# Patient Record
Sex: Male | Born: 1990 | Race: Black or African American | Hispanic: No | Marital: Single | Smoking: Never smoker
Health system: Southern US, Community
[De-identification: ages and names within clinical notes are randomized; demographics above are authoritative.]

---
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
## 2016-11-07 ENCOUNTER — Encounter (HOSPITAL_BASED_OUTPATIENT_CLINIC_OR_DEPARTMENT_OTHER): Payer: Self-pay | Admitting: Emergency Medicine

## 2016-11-07 ENCOUNTER — Emergency Department (HOSPITAL_BASED_OUTPATIENT_CLINIC_OR_DEPARTMENT_OTHER)
Admission: EM | Admit: 2016-11-07 | Discharge: 2016-11-07 | Disposition: A | Discharge status: Home / Self Care | Payer: BLUE CROSS/BLUE SHIELD | Attending: Emergency Medicine | Admitting: Emergency Medicine

## 2016-11-07 ENCOUNTER — Emergency Department (HOSPITAL_BASED_OUTPATIENT_CLINIC_OR_DEPARTMENT_OTHER): Payer: BLUE CROSS/BLUE SHIELD

## 2016-11-07 DIAGNOSIS — R072 Precordial pain: Secondary | ICD-10-CM | POA: Diagnosis not present

## 2016-11-07 DIAGNOSIS — J029 Acute pharyngitis, unspecified: Secondary | ICD-10-CM | POA: Insufficient documentation

## 2016-11-07 DIAGNOSIS — R0789 Other chest pain: Secondary | ICD-10-CM | POA: Diagnosis not present

## 2016-11-07 DIAGNOSIS — R079 Chest pain, unspecified: Secondary | ICD-10-CM | POA: Diagnosis not present

## 2016-11-07 LAB — CBC
HCT: 43.1 % (ref 39.0–52.0)
Hemoglobin: 15 g/dL (ref 13.0–17.0)
MCH: 30.5 pg (ref 26.0–34.0)
MCHC: 34.8 g/dL (ref 30.0–36.0)
MCV: 87.8 fL (ref 78.0–100.0)
Platelets: 243 10*3/uL (ref 150–400)
RBC: 4.91 MIL/uL (ref 4.22–5.81)
RDW: 13.2 % (ref 11.5–15.5)
WBC: 8.6 10*3/uL (ref 4.0–10.5)

## 2016-11-07 LAB — BASIC METABOLIC PANEL
Anion gap: 9 (ref 5–15)
BUN: 14 mg/dL (ref 6–20)
CO2: 26 mmol/L (ref 22–32)
Calcium: 9.6 mg/dL (ref 8.9–10.3)
Chloride: 101 mmol/L (ref 101–111)
Creatinine, Ser: 0.98 mg/dL (ref 0.61–1.24)
GFR calc Af Amer: >60 mL/min (ref >60)
GFR calc non Af Amer: >60 mL/min (ref >60)
Glucose, Bld: 105 mg/dL — ABNORMAL HIGH (ref 65–99)
Potassium: 3.4 mmol/L — ABNORMAL LOW (ref 3.5–5.1)
Sodium: 136 mmol/L (ref 135–145)

## 2016-11-07 LAB — TROPONIN I: Troponin I: <0.03 ng/mL (ref <0.03)

## 2016-11-07 LAB — RAPID STREP SCREEN (MED CTR MEBANE ONLY): Streptococcus, Group A Screen (Direct): NEGATIVE

## 2016-11-07 MED ORDER — ALUM & MAG HYDROXIDE-SIMETH 200-200-20 MG/5ML PO SUSP
15.0000 mL | Freq: Once | ORAL | Status: AC
  Administered 2016-11-07: 15 mL via ORAL
  Filled 2016-11-07: qty 30

## 2016-11-07 MED ORDER — FAMOTIDINE 20 MG PO TABS
20.0000 mg | ORAL_TABLET | Freq: Once | ORAL | Status: AC
  Administered 2016-11-07: 20 mg via ORAL
  Filled 2016-11-07: qty 1

## 2016-11-07 MED ORDER — POTASSIUM CHLORIDE CRYS ER 20 MEQ PO TBCR
20.0000 meq | EXTENDED_RELEASE_TABLET | Freq: Once | ORAL | Status: AC
  Administered 2016-11-07: 20 meq via ORAL
  Filled 2016-11-07: qty 1

## 2016-11-07 MED ORDER — ACETAMINOPHEN 500 MG PO TABS
1000.0000 mg | ORAL_TABLET | Freq: Once | ORAL | Status: AC
  Administered 2016-11-07: 1000 mg via ORAL
  Filled 2016-11-07: qty 2

## 2016-11-07 NOTE — ED Triage Notes (Signed)
Pt presents with c/o sore throat and chest pain for a few days.

## 2016-11-07 NOTE — ED Notes (Signed)
Pt. returned from XR. 

## 2016-11-07 NOTE — ED Notes (Signed)
ED Provider at bedside. 

## 2016-11-07 NOTE — ED Provider Notes (Addendum)
[facility]   CSN: [id number] & time: 11/07/16  2056     History   Chief Complaint Chief Complaint  Patient presents with  . Chest Pain    HPI Jonathan Ferguson is a 26 y.o. male.  Patient c/o mid chest pain for the past few days. Pain constant, dull, mild. Occurs at rest. No relation to activity or exertion. No associated sob, nv or diaphoresis. Mild scratchy throat. No trouble breathing or swallowing. Denies hx gerd, or having heartburn. No pleuritic pain. No leg pain or swelling. No prior hx heart disease. No fam hx premature cad. Denies cough. No fever or chills. Denies chest wall injury or strain. No drug use.     Chest Pain   Pertinent negatives include no abdominal pain, no back pain, no cough, no fever, no headaches, no nausea, no palpitations, no shortness of breath and no vomiting.    History reviewed. No pertinent past medical history.  There are no active problems to display for this patient.   History reviewed. No pertinent surgical history.     Home Medications    Prior to Admission medications   Not on File    Family History No family history on file.  Social History Social History  Substance Use Topics  . Smoking status: Not on file  . Smokeless tobacco: Not on file  . Alcohol use Not on file     Allergies   Patient has no known allergies.   Review of Systems Review of Systems  Constitutional: Negative for chills and fever.  HENT: Positive for sore throat.   Eyes: Negative for redness.  Respiratory: Negative for cough and shortness of breath.   Cardiovascular: Positive for chest pain. Negative for palpitations and leg swelling.  Gastrointestinal: Negative for abdominal pain, nausea and vomiting.  Genitourinary: Negative for flank pain.  Musculoskeletal: Negative for back pain and neck pain.  Skin: Negative for rash.  Neurological: Negative for headaches.  Hematological: Does not bruise/bleed  easily.  Psychiatric/Behavioral: Negative for confusion.     Physical Exam Updated Vital Signs BP (!) 142/87 (BP Location: Right Arm)   Pulse 72   Temp 98 F (36.7 C) (Oral)   Resp (!) 23   SpO2 100%   Physical Exam  Constitutional: He appears well-developed and well-nourished. No distress.  HENT:  Mouth/Throat: Oropharynx is clear and moist.  Eyes: Conjunctivae are normal.  Neck: Neck supple. No tracheal deviation present.  Cardiovascular: Normal rate, regular rhythm, normal heart sounds and intact distal pulses.  Exam reveals no gallop and no friction rub.   No murmur heard. Pulmonary/Chest: Effort normal and breath sounds normal. No accessory muscle usage. No respiratory distress. He exhibits no tenderness.  Abdominal: Soft. Bowel sounds are normal. He exhibits no distension. There is no tenderness.  Musculoskeletal: He exhibits no edema or tenderness.  Neurological: He is alert.  Skin: Skin is warm and dry. He is not diaphoretic.  Psychiatric: He has a normal mood and affect.  Nursing note and vitals reviewed.    ED Treatments / Results  Labs (all labs ordered are listed, but only abnormal results are displayed) Results for orders placed or performed during the hospital encounter of 11/07/16  Rapid strep screen  Result Value Ref Range   Streptococcus, Group A Screen (Direct) NEGATIVE NEGATIVE  Basic metabolic panel  Result Value Ref Range   Sodium 136 135 - 145 mmol/L   Potassium 3.4 (L) 3.5 - 5.1 mmol/L  Chloride 101 101 - 111 mmol/L   CO2 26 22 - 32 mmol/L   Glucose, Bld 105 (H) 65 - 99 mg/dL   BUN 14 6 - 20 mg/dL   Creatinine, Ser1.610.98 0.61 - 1.24 mg/dL   Calcium 9.6 8.9 - 09.6 mg/dL   GFR calc non Af Amer >60 >60 mL/min   GFR calc Af Amer >60 >60 mL/min   Anion gap 9 5 - 15  CBC  Result Value Ref Range   WBC 8.6 4.0 - 10.5 K/uL   RBC 4.91 4.22 - 5.81 MIL/uL   Hemoglobin 15.0 13.0 - 17.0 g/dL   HCT 04.5 40.9 - 81.1 %   MCV 87.8 78.0 - 100.0 fL   MCH  30.5 26.0 - 34.0 pg   MCHC 34.8 30.0 - 36.0 g/dL   RDW 91.4 78.2 - 95.6 %   Platelets 243 150 - 400 K/uL  Troponin I  Result Value Ref Range   Troponin I <0.03 <0.03 ng/mL   Dg Chest 2 View  Result Date: 11/07/2016 CLINICAL DATA:  Sore throat x3 days with left upper chest pain. EXAM: CHEST  2 VIEW COMPARISON:  None. FINDINGS: The heart size and mediastinal contours are within normal limits. Both lungs are clear. No pulmonary consolidation, effusion or pneumothorax. The visualized skeletal structures are unremarkable. IMPRESSION: No active cardiopulmonary disease. Electronically Signed   By: Tollie Eth M.D.   On: 11/07/2016 21:38    EKG  EKG Interpretation  Date/Time:  Saturday November 07 2016 21:30:86 EDT Ventricular Rate:  80 PR Interval:    QRS Duration: 93 QT Interval:  365 QTC Calculation: 421 R Axis:   95 Text Interpretation:  Sinus rhythm Non-specific ST-t changes No previous tracing Confirmed by Cathren Laine ([id number]) on 11/07/2016 9:20:48 PM       Radiology Dg Chest 2 View  Result Date: 11/07/2016 CLINICAL DATA:  Sore throat x3 days with left upper chest pain. EXAM: CHEST  2 VIEW COMPARISON:  None. FINDINGS: The heart size and mediastinal contours are within normal limits. Both lungs are clear. No pulmonary consolidation, effusion or pneumothorax. The visualized skeletal structures are unremarkable. IMPRESSION: No active cardiopulmonary disease. Electronically Signed   By: Tollie Eth M.D.   On: 11/07/2016 21:38    Procedures Procedures (including critical care time)  Medications Ordered in ED Medications  acetaminophen (TYLENOL) tablet 1,000 mg (not administered)  alum & mag hydroxide-simeth (MAALOX/MYLANTA) 200-200-20 MG/5ML suspension 15 mL (not administered)  famotidine (PEPCID) tablet 20 mg (not administered)     Initial Impression / Assessment and Plan / ED Course  I have reviewed the triage vital signs and the nursing notes.  Pertinent labs & imaging  results that were available during my care of the patient were reviewed by me and considered in my medical decision making (see chart for details).  Labs. Ecg. Cxr.   After symptoms for past few days, trop 0 - symptoms/eval do not appears c/w ACS.  Will try pepcid, maalox, acetaminophen for symptom relief.  From labs, k sl low, kcl po.  cxr neg. No cm. Lung clear.   On recheck, pt resting comfortably. No distress.   Pt currently appears stable for d/c.     Final Clinical Impressions(s) / ED Diagnoses   Final diagnoses:  None    New Prescriptions New Prescriptions   No medications on file       Cathren Laine, MD 11/07/16 2246

## 2016-11-07 NOTE — Discharge Instructions (Signed)
It was our pleasure to provide your ER care today - we hope that you feel better.  Take acetaminophen and/or ibuprofen as need for pain.    If gi symptoms/heartburn, you may try pepcid and maalox as need.   Follow up with primary care doctor in the next 1-2 weeks.   Your blood pressure is borderline high today - have it rechecked by primary care doctor.   Return to ER if worse, persistent or recurrent chest pain, trouble breathing, high fevers, other concern.   From today's lab tests, your potassium level is slightly low (3.4) - eat plenty of fruits and vegetables, and follow up with primary care doctor in the next 1-2 weeks.

## 2016-11-09 ENCOUNTER — Emergency Department (HOSPITAL_COMMUNITY)
Admission: EM | Admit: 2016-11-09 | Discharge: 2016-11-10 | Disposition: A | Discharge status: Home / Self Care | Payer: BLUE CROSS/BLUE SHIELD | Attending: Emergency Medicine | Admitting: Emergency Medicine

## 2016-11-09 DIAGNOSIS — J029 Acute pharyngitis, unspecified: Secondary | ICD-10-CM | POA: Diagnosis present

## 2016-11-09 DIAGNOSIS — B349 Viral infection, unspecified: Secondary | ICD-10-CM

## 2016-11-09 NOTE — ED Triage Notes (Signed)
Pt arrives via POv from home with chest pain with coughing, sore throat and bodyaches. Denies recent fever. VSS.

## 2016-11-09 NOTE — ED Provider Notes (Signed)
[facility]-EMERGENCY DEPT Provider Note   CSN: [id number] Arrival date & time: 11/09/16  1819     History   Chief Complaint Chief Complaint  Patient presents with  . Generalized Body Aches  . Sore Throat    HPI Jonathan Ferguson is a 26 y.o. male.  HPI   26 year old Male presenting with cold symptoms. For the past 2-3 days patient has had chest congestion, throat irritation nonproductive cough, body aches, decreased in activity level and occasional chills. He was initially seen in the ER for the symptoms 2 days ago for she initial chest pain. His workup was unremarkable. Normal chest x-ray, mild hypokalemia which he receive some supplementation, his EKG was unremarkable. He is not a smoker, does have family history of cardiac disease but no appreciable mature cardiac death. Denies any recent sick contact. Denies any shortness of breath, no risk factor for PE. No report of nausea vomiting or diarrhea, no abdominal pain or back pain.  No past medical history on file.  There are no active problems to display for this patient.   No past surgical history on file.     Home Medications    Prior to Admission medications   Not on File    Family History No family history on file.  Social History Social History  Substance Use Topics  . Smoking status: Not on file  . Smokeless tobacco: Not on file  . Alcohol use Not on file     Allergies   Patient has no known allergies.   Review of Systems Review of Systems  All other systems reviewed and are negative.    Physical Exam Updated Vital Signs BP 129/75 (BP Location: Right Arm)   Pulse (!) 57   Temp 98.8 F (37.1 C) (Oral)   Resp 14   Ht  (1.93 m)   Wt 88.5 kg (195 lb)   SpO2 99%   BMI 23.74 kg/m   Physical Exam  Constitutional: He is oriented to person, place, and time. He appears well-developed and well-nourished. No distress.  HENT:  Head: Atraumatic.  Right Ear: External ear normal.  Left Ear:  External ear normal.  Nose: Nose normal.  Mouth/Throat: Oropharynx is clear and moist.  Eyes: Conjunctivae are normal.  Neck: Normal range of motion. Neck supple.  No nuchal rigidity  Cardiovascular: Normal rate, regular rhythm and intact distal pulses.  Exam reveals no gallop and no friction rub.   No murmur heard. Pulmonary/Chest: Effort normal and breath sounds normal. No respiratory distress. He has no wheezes. He has no rales. He exhibits no tenderness.  Abdominal: Soft. Bowel sounds are normal. He exhibits no distension. There is no tenderness.  Musculoskeletal: He exhibits no edema.  Neurological: He is alert and oriented to person, place, and time.  Skin: No rash noted.  Psychiatric: He has a normal mood and affect.  Nursing note and vitals reviewed.    ED Treatments / Results  Labs (all labs ordered are listed, but only abnormal results are displayed) Labs Reviewed - No data to display  EKG  EKG Interpretation None       Radiology No results found.  Procedures Procedures (including critical care time)  Medications Ordered in ED Medications - No data to display   Initial Impression / Assessment and Plan / ED Course  I have reviewed the triage vital signs and the nursing notes.  Pertinent labs & imaging results that were available during my care of the patient were reviewed by  me and considered in my medical decision making (see chart for details).     BP 129/75 (BP Location: Right Arm)   Pulse (!) 57   Temp 98.8 F (37.1 C) (Oral)   Resp 14   Ht  (1.93 m)   Wt 88.5 kg (195 lb)   SpO2 99%   BMI 23.74 kg/m    Final Clinical Impressions(s) / ED Diagnoses   Final diagnoses:  Viral illness    New Prescriptions New Prescriptions   No medications on file   Pt symptoms consistent with URI. CXR 2 days ago negative for acute infiltrate. sxs not suggestive of ACS or PE.  Throat exam unremarkable.  Doubt strep infection.  Pt will be discharged  with symptomatic treatment.  Discussed return precautions.  Pt is hemodynamically stable & in NAD prior to discharge.    Fayrene Helper, PA-C 11/10/16 0006    Linwood Dibbles, MD 11/11/16 [phone]

## 2016-11-10 LAB — CULTURE, GROUP A STREP (THRC)

## 2016-11-10 MED ORDER — BENZONATATE 100 MG PO CAPS: 100.0000 mg | ORAL_CAPSULE | Freq: Three times a day (TID) | ORAL | 0 refills | Status: DC

## 2016-11-10 MED ORDER — PROMETHAZINE-DM 6.25-15 MG/5ML PO SYRP: 5.0000 mL | ORAL_SOLUTION | Freq: Four times a day (QID) | ORAL | 0 refills | Status: DC | PRN

## 2016-11-13 ENCOUNTER — Encounter (HOSPITAL_COMMUNITY): Payer: Self-pay | Admitting: *Deleted

## 2016-11-13 ENCOUNTER — Emergency Department (HOSPITAL_COMMUNITY)
Admission: EM | Admit: 2016-11-13 | Discharge: 2016-11-13 | Disposition: A | Discharge status: Home / Self Care | Payer: BLUE CROSS/BLUE SHIELD | Attending: Emergency Medicine | Admitting: Emergency Medicine

## 2016-11-13 ENCOUNTER — Emergency Department (HOSPITAL_COMMUNITY): Payer: BLUE CROSS/BLUE SHIELD

## 2016-11-13 DIAGNOSIS — B349 Viral infection, unspecified: Secondary | ICD-10-CM | POA: Diagnosis not present

## 2016-11-13 DIAGNOSIS — R0789 Other chest pain: Secondary | ICD-10-CM | POA: Insufficient documentation

## 2016-11-13 DIAGNOSIS — R079 Chest pain, unspecified: Secondary | ICD-10-CM | POA: Diagnosis not present

## 2016-11-13 MED ORDER — ONDANSETRON HCL 4 MG PO TABS: 4.0000 mg | ORAL_TABLET | Freq: Three times a day (TID) | ORAL | 0 refills | Status: DC | PRN

## 2016-11-13 NOTE — ED Provider Notes (Signed)
[facility]-EMERGENCY DEPT Provider Note   CSN: [id number] Arrival date & time: 11/13/16  1733     History   Chief Complaint Chief Complaint  Patient presents with  . Torticollis    HPI Jonathan Ferguson is a 26 y.o. male with no significant past medical history who presents today with chief complaint gradual onset, ongoing intermittent chest pain, rare nonproductive cough, scratchy throat, and generalized fatigue for 1 week. Patient was seen and evaluated on 11/07/2016 and 11/09/2016 for the same and was found to be stable for discharge home with thought of likely viral syndrome. He states that chest pain is more of a mild crampy discomfort which only occurs when he swallows or turns certain ways. At times right-sided, other times left-sided. Does not worsen with position changes. Also endorses mildly scratchy throat with some pain on swallowing, but denies drooling or facial swelling. No throat tightness. He is able to eat and drink, but endorses decreased appetite. He also endorses nausea but denies abdominal pain or vomiting. Denies shortness of breath, DOE, orthopnea, leg swelling, fevers, chills. He has tried [employer] and promethazine without relief. He denies nasal congestion, ear pain, headaches, or any other symptoms. No recent travel or surgeries, no hemoptysis, no prior history of DVT or PE, no history of cancer, he is a nonsmoker, he is not on testosterone therapy. no history of drug use.  The history is provided by the patient.    History reviewed. No pertinent past medical history.  There are no active problems to display for this patient.   History reviewed. No pertinent surgical history.     Home Medications    Prior to Admission medications   Medication Sig Start Date End Date Taking? Authorizing Provider  benzonatate (TESSALON) 100 MG capsule Take 1 capsule (100 mg total) by mouth every 8 (eight) hours. 11/10/16   Fayrene Helper, PA-C  ondansetron (ZOFRAN) 4 MG  tablet Take 1 tablet (4 mg total) by mouth every 8 (eight) hours as needed for nausea or vomiting. 11/13/16   Jeanie Sewer, PA-C    Family History No family history on file.  Social History Social History  Substance Use Topics  . Smoking status: Never Smoker  . Smokeless tobacco: Never Used  . Alcohol use No     Allergies   Patient has no known allergies.   Review of Systems Review of Systems  Constitutional: Positive for fatigue. Negative for chills and fever.  HENT: Positive for sore throat. Negative for congestion, drooling, facial swelling, rhinorrhea, sinus pain and sinus pressure.   Respiratory: Positive for cough. Negative for shortness of breath and wheezing.   Cardiovascular: Positive for chest pain. Negative for palpitations and leg swelling.  Gastrointestinal: Positive for nausea. Negative for abdominal pain and vomiting.  Musculoskeletal: Negative for back pain and neck pain.  Neurological: Negative for syncope, weakness, numbness and headaches.  All other systems reviewed and are negative.    Physical Exam Updated Vital Signs BP 121/72 (BP Location: Left Arm)   Pulse 60   Temp 99 F (37.2 C) (Oral)   Resp 17   SpO2 100%   Physical Exam  Constitutional: He is oriented to person, place, and time. He appears well-developed and well-nourished. No distress.  HENT:  Head: Normocephalic and atraumatic.  Right Ear: External ear normal.  Left Ear: External ear normal.  Mouth/Throat: Oropharynx is clear and moist.  TMs without bulging or erythema bilaterally. No frontal or maxillary sinus TTP. Nasal septum is midline  with pink mucosa and no nasal congestion. Posterior oropharynx without any significant erythema, tonsillar hypertrophy, uvular deviation, or exudates. No trismus or sublingual abnormalities.  Eyes: Pupils are equal, round, and reactive to light. Conjunctivae and EOM are normal. Right eye exhibits no discharge. Left eye exhibits no discharge.  Neck:  Normal range of motion. Neck supple. No JVD present. No tracheal deviation present. No thyromegaly present.  Left anterior cervical LAD. No midline spine TTP, no paraspinal muscle tenderness, no deformity, crepitus, or step-off noted   Cardiovascular: Normal rate, regular rhythm, normal heart sounds and intact distal pulses.   2+ radial and DP/PT pulses bl, negative Homan's bl   Pulmonary/Chest: Effort normal and breath sounds normal. No respiratory distress. He has no wheezes. He has no rales. He exhibits no tenderness.  Equal rise and fall of chest, no increased work of breathing  Abdominal: Soft. Bowel sounds are normal. There is no tenderness.  Musculoskeletal: Normal range of motion. He exhibits no edema or tenderness.  Neurological: He is alert and oriented to person, place, and time.  Skin: Skin is warm and dry. No erythema.  Psychiatric: He has a normal mood and affect. His behavior is normal.  Nursing note and vitals reviewed.    ED Treatments / Results  Labs (all labs ordered are listed, but only abnormal results are displayed) Labs Reviewed - No data to display  EKG  EKG Interpretation None       Radiology Dg Chest 2 View  Result Date: 11/13/2016 CLINICAL DATA:  Patient c/o CP x [redacted] week along with SOB. States that CP goes off and on. EXAM: CHEST  2 VIEW COMPARISON:  11/07/16 FINDINGS: The lungs are clear. The pulmonary vasculature is normal. Heart size is normal. Hilar and mediastinal contours are unremarkable. There is no pleural effusion. IMPRESSION: No active cardiopulmonary disease. Electronically Signed   By: Ellery Plunk M.D.   On: 11/13/2016 21:37    Procedures Procedures (including critical care time)  Medications Ordered in ED Medications - No data to display   Initial Impression / Assessment and Plan / ED Course  I have reviewed the triage vital signs and the nursing notes.  Pertinent labs & imaging results that were available during my care of  the patient were reviewed by me and considered in my medical decision making (see chart for details).     Patient with ongoing viral-type symptoms. Afebrile, vital signs are stable. He is nontoxic in appearance. EKG reviewed by my attending Dr. Estell Harpin and found to have no significant change from last tracing, no evidence of ST segment abnormality or arrhythmia. Chest x-ray shows no acute cardio pulmonary abnormalities. No evidence of pneumonia or pleural effusion. He is extremely low risk for cardiac etiology of his symptoms, and I doubt ACS or MI contributing. Low suspicion of pericarditis, myocarditis, and he is PERC negative so doubt PE. History and physical examination suggestive of viral etiology of symptoms. Examination of sore throat not concerning for strep pharyngitis, Ludwig's angina, PTA, or soft tissues infection of the neck. Possible mononucleosis, however he declines mono testing at this time. Alternatively, the symptoms could be related to a viral bronchitis. Advised of symptomatic treatment. Will discharge with Zofran as Phenergan has not been helpful. Recommend follow-up with primary care physician. Discussed indications for return to the ED. Patient and patient's father verbalized understanding of and agreement with plan and patient is stable for discharge at this time.  Final Clinical Impressions(s) / ED Diagnoses   Final  diagnoses:  Atypical chest pain  Viral disease    New Prescriptions Discharge Medication List as of 11/13/2016  9:48 PM       Jeanie Sewer, PA-C 11/13/16 2227    Bethann Berkshire, MD 11/13/16 2303

## 2016-11-13 NOTE — ED Notes (Signed)
Pt reports neck pain, sore throat, and cp x 1 week.  Was seen at [facility] Monday for same and was told it was viral infection.  He was given tessalon pearls and phenergan for cough without relief.  Pt reports he is not getting better.  Pt reports cough at times making his chest feel tight.  Pt is A&O x 4.  Family at bedside.

## 2016-11-13 NOTE — Discharge Instructions (Signed)
Alternate 600 mg of ibuprofen and [phone] mg of Tylenol every 3 hours as needed for pain. Do not exceed 4000 mg of Tylenol daily. Use warm water salt gargles, warm teas or soup, and throat lozenges to help with your scratchy throat. Take Zofran as needed up to 3 times daily for nausea. Stop taking Phenergan with this medication. Follow up with a primary care physician for reevaluation of your symptoms if they persist. Return to the ED immediately if any concerning signs or symptoms develop such as persistent chest pain, shortness of breath, coughing up blood, or fevers.

## 2016-11-18 DIAGNOSIS — R079 Chest pain, unspecified: Secondary | ICD-10-CM | POA: Diagnosis not present

## 2016-11-18 DIAGNOSIS — J029 Acute pharyngitis, unspecified: Secondary | ICD-10-CM | POA: Diagnosis not present

## 2016-11-24 DIAGNOSIS — J029 Acute pharyngitis, unspecified: Secondary | ICD-10-CM | POA: Diagnosis not present

## 2016-11-24 DIAGNOSIS — J069 Acute upper respiratory infection, unspecified: Secondary | ICD-10-CM | POA: Diagnosis not present

## 2016-12-06 DIAGNOSIS — M5489 Other dorsalgia: Secondary | ICD-10-CM | POA: Diagnosis not present

## 2016-12-06 DIAGNOSIS — M62838 Other muscle spasm: Secondary | ICD-10-CM | POA: Diagnosis not present

## 2016-12-14 DIAGNOSIS — R946 Abnormal results of thyroid function studies: Secondary | ICD-10-CM | POA: Diagnosis not present

## 2016-12-14 DIAGNOSIS — R079 Chest pain, unspecified: Secondary | ICD-10-CM | POA: Diagnosis not present

## 2016-12-14 DIAGNOSIS — R072 Precordial pain: Secondary | ICD-10-CM | POA: Diagnosis not present

## 2016-12-14 DIAGNOSIS — R55 Syncope and collapse: Secondary | ICD-10-CM | POA: Diagnosis not present

## 2016-12-14 DIAGNOSIS — R748 Abnormal levels of other serum enzymes: Secondary | ICD-10-CM | POA: Diagnosis not present

## 2016-12-18 DIAGNOSIS — R7989 Other specified abnormal findings of blood chemistry: Secondary | ICD-10-CM | POA: Diagnosis not present

## 2016-12-18 DIAGNOSIS — R0789 Other chest pain: Secondary | ICD-10-CM | POA: Diagnosis not present

## 2017-03-04 DIAGNOSIS — J029 Acute pharyngitis, unspecified: Secondary | ICD-10-CM | POA: Diagnosis not present

## 2017-04-28 DIAGNOSIS — M25562 Pain in left knee: Secondary | ICD-10-CM | POA: Diagnosis not present

## 2017-07-14 DIAGNOSIS — J029 Acute pharyngitis, unspecified: Secondary | ICD-10-CM | POA: Diagnosis not present

## 2017-09-27 DIAGNOSIS — J069 Acute upper respiratory infection, unspecified: Secondary | ICD-10-CM | POA: Diagnosis not present

## 2017-11-17 DIAGNOSIS — J029 Acute pharyngitis, unspecified: Secondary | ICD-10-CM | POA: Diagnosis not present

## 2019-01-15 IMAGING — CR DG CHEST 2V
2 series · 2 of 2 positions shown · non-contrast
Comparison: 11/07/16

CLINICAL DATA: Patient c/o CP x 1 week along with SOB. States that
CP goes off and on.

EXAM:
CHEST  2 VIEW

[w chest pa]
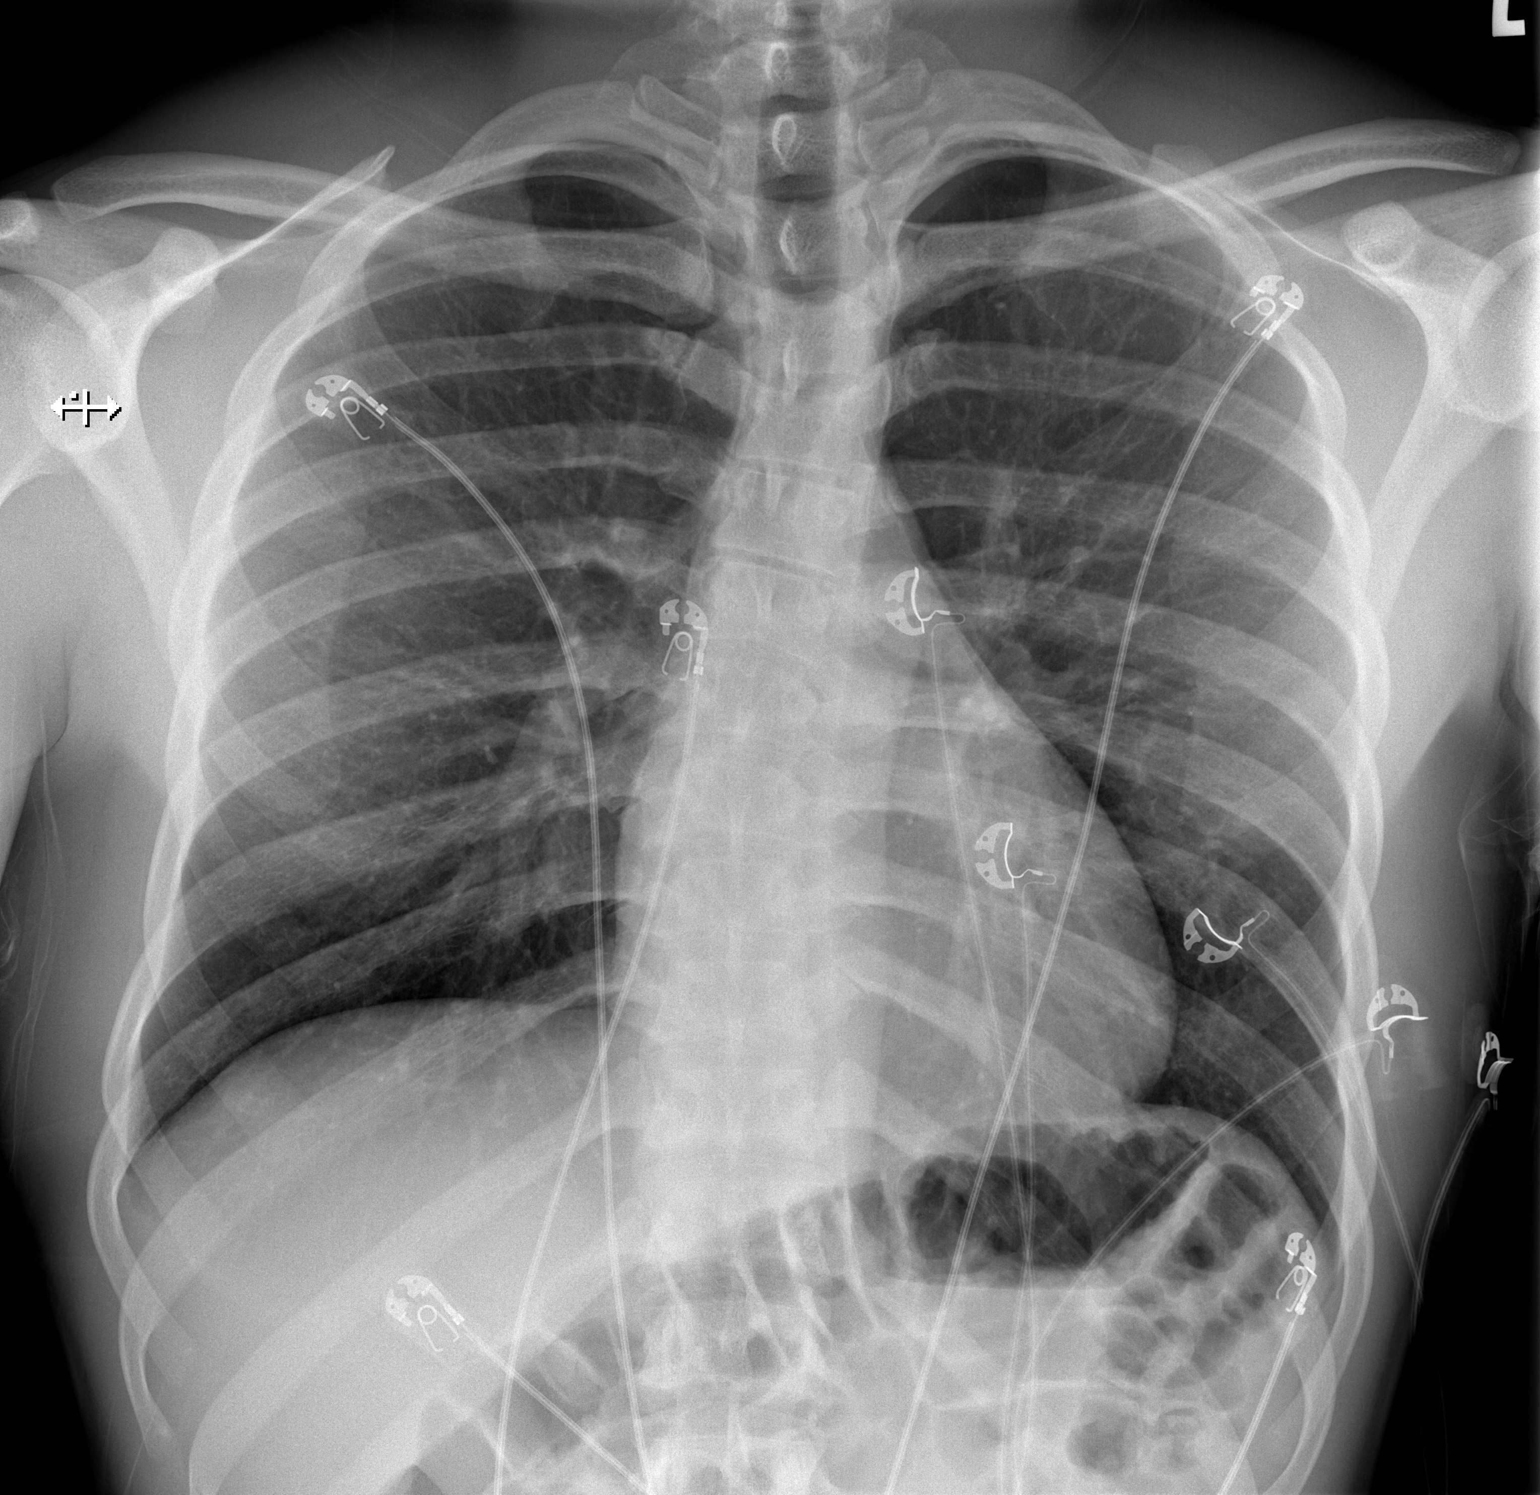

[w chest lat]
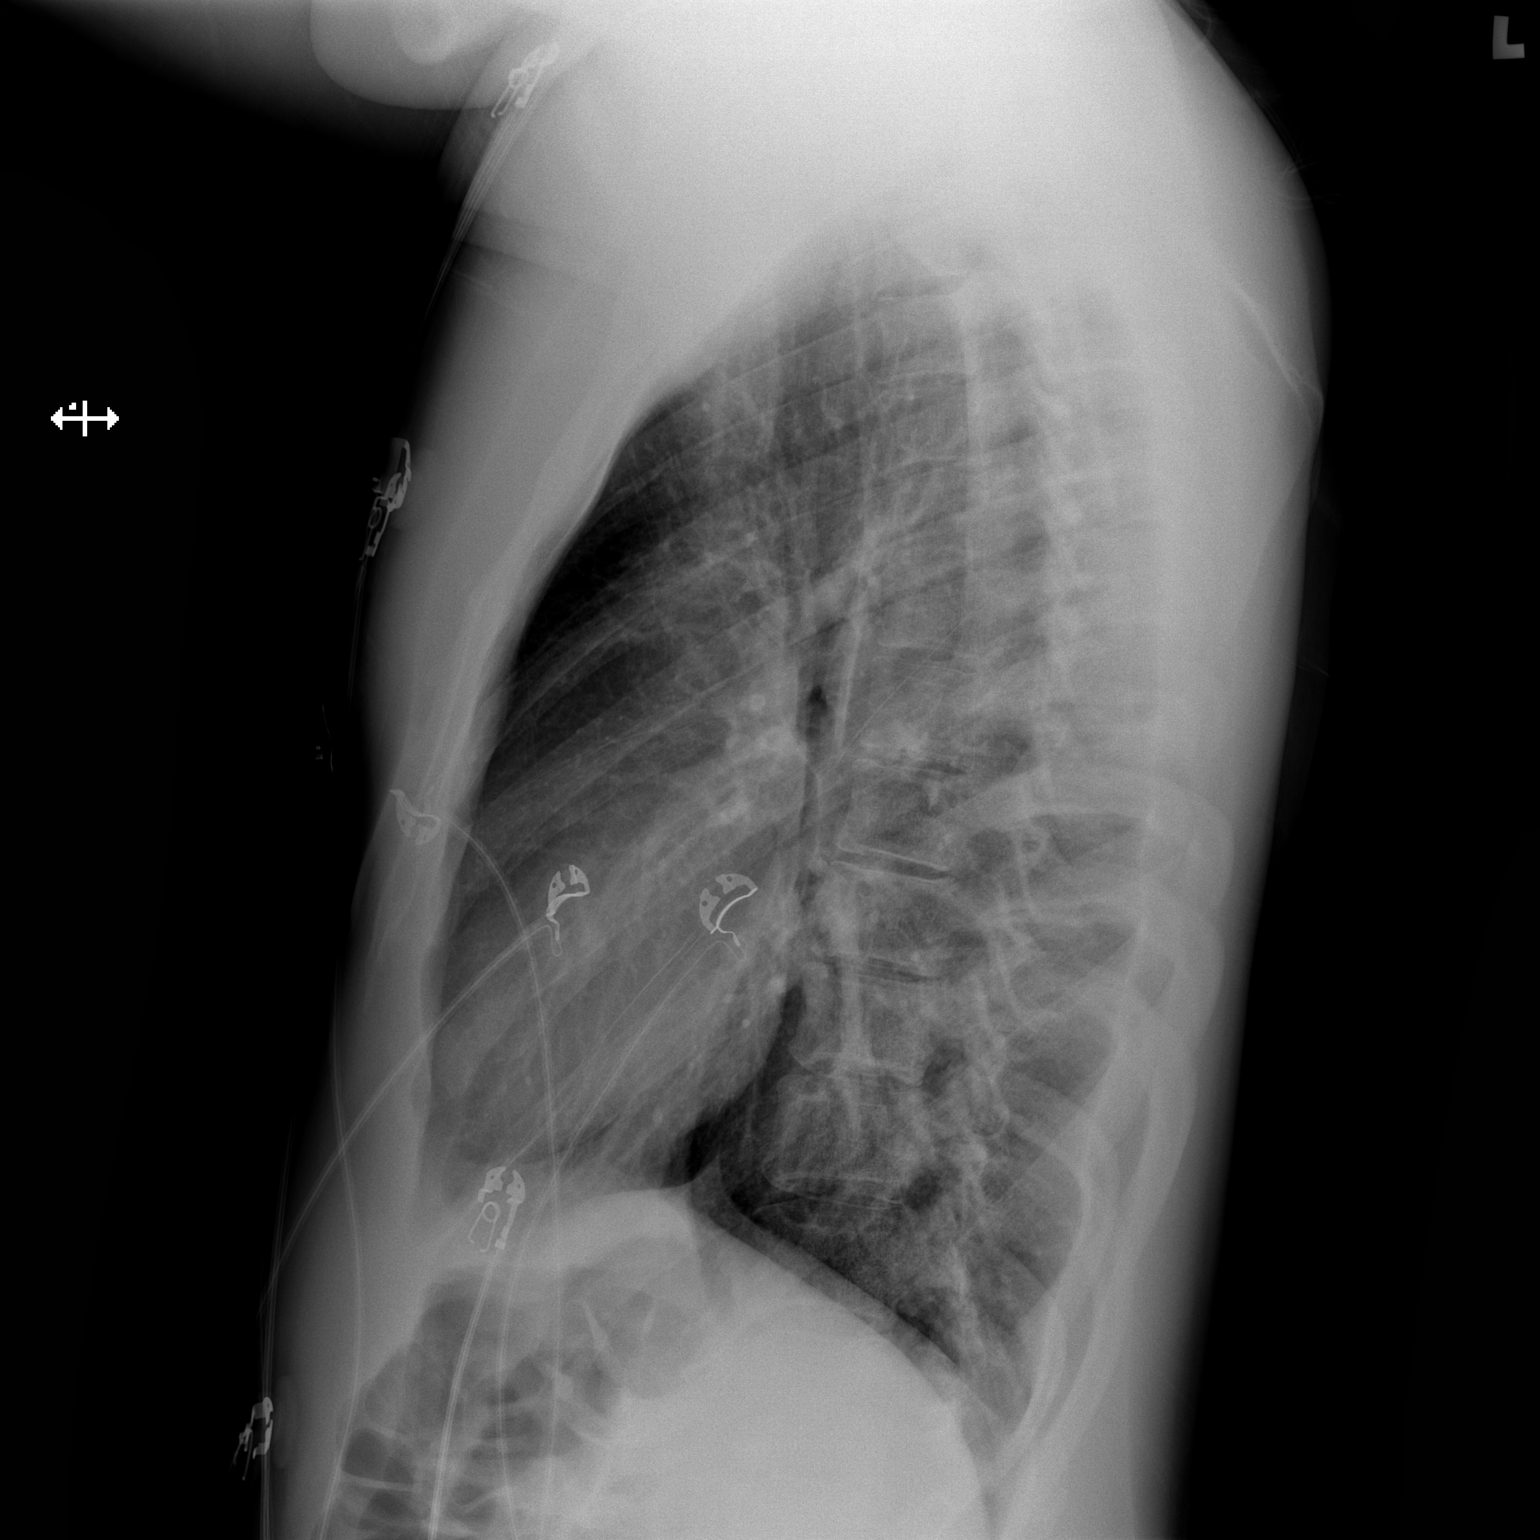

[2 of 2 positions shown; findings below may reference images not displayed]

FINDINGS: The lungs are clear. The pulmonary vasculature is normal. Heart size
is normal. Hilar and mediastinal contours are unremarkable. There is
no pleural effusion.
IMPRESSION: No active cardiopulmonary disease.

## 2020-12-21 ENCOUNTER — Ambulatory Visit: Admit: 2020-12-21 | Payer: Self-pay

## 2020-12-23 ENCOUNTER — Ambulatory Visit (INDEPENDENT_AMBULATORY_CARE_PROVIDER_SITE_OTHER): Payer: 59

## 2020-12-23 ENCOUNTER — Ambulatory Visit
Admission: EM | Admit: 2020-12-23 | Discharge: 2020-12-23 | Disposition: A | Discharge status: Home / Self Care | Payer: Self-pay | Source: Home / Self Care

## 2020-12-23 ENCOUNTER — Other Ambulatory Visit: Payer: Self-pay

## 2020-12-23 ENCOUNTER — Ambulatory Visit
Admission: EM | Admit: 2020-12-23 | Discharge: 2020-12-23 | Disposition: A | Discharge status: Home / Self Care | Payer: 59 | Attending: Physician Assistant | Admitting: Physician Assistant

## 2020-12-23 DIAGNOSIS — S99912A Unspecified injury of left ankle, initial encounter: Secondary | ICD-10-CM

## 2020-12-23 DIAGNOSIS — M25572 Pain in left ankle and joints of left foot: Secondary | ICD-10-CM

## 2020-12-23 NOTE — ED Provider Notes (Signed)
[facility]    CSN: [id number] Arrival date & time: 12/23/20  1001      History   Chief Complaint Chief Complaint  Patient presents with   appt 10 - ankle pain    HPI Jonathan Ferguson is a 30 y.o. male.   Patient here today for evaluation of left posterior lateral ankle pain that started after he rolled his ankle about 10 days ago.  He reports that he has had surgery about ankle in the past.  He states since rolling his ankle recently he has had increased discomfort and has been wearing a cam boot which does seem to be helpful.  He does not report treatment otherwise.  He denies any numbness or tingling.  Walking makes pain worse.  The history is provided by the patient.   History reviewed. No pertinent past medical history.  There are no problems to display for this patient.   History reviewed. No pertinent surgical history.     Home Medications    Prior to Admission medications   Not on File    Family History History reviewed. No pertinent family history.  Social History Social History   Tobacco Use   Smoking status: Never   Smokeless tobacco: Never  Substance Use Topics   Alcohol use: No   Drug use: No     Allergies   Patient has no known allergies.   Review of Systems Review of Systems  Constitutional:  Negative for chills and fever.  Eyes:  Negative for discharge and redness.  Respiratory:  Negative for shortness of breath.   Musculoskeletal:  Positive for arthralgias and joint swelling.  Skin:  Positive for color change and wound.  Neurological:  Negative for numbness.    Physical Exam Triage Vital Signs ED Triage Vitals  Enc Vitals Group     BP      Pulse      Resp      Temp      Temp src      SpO2      Weight      Height      Head Circumference      Peak Flow      Pain Score      Pain Loc      Pain Edu?      Excl. in GC?    No data found.  Updated Vital Signs BP (!) 147/87 (BP Location: Left Arm)    Pulse 67   Temp 98.3 F (36.8 C) (Oral)   Resp 18   SpO2 97%     Physical Exam Vitals and nursing note reviewed.  Constitutional:      General: He is not in acute distress.    Appearance: Normal appearance. He is not ill-appearing.  HENT:     Head: Normocephalic and atraumatic.  Eyes:     Conjunctiva/sclera: Conjunctivae normal.  Cardiovascular:     Rate and Rhythm: Normal rate.  Pulmonary:     Effort: Pulmonary effort is normal.  Musculoskeletal:     Comments: Full ROM of left ankle.  Mild tenderness to palpation to posterior lateral ankle.  Full ROM of left toes.  Neurological:     Mental Status: He is alert.     Comments: First sensation intact to distal left toes  Psychiatric:        Mood and Affect: Mood normal.        Behavior: Behavior normal.        Thought  Content: Thought content normal.     UC Treatments / Results  Labs (all labs ordered are listed, but only abnormal results are displayed) Labs Reviewed - No data to display  EKG   Radiology DG Ankle Complete Left  Result Date: 12/23/2020 CLINICAL DATA:  Trauma, pain EXAM: LEFT ANKLE COMPLETE - 3+ VIEW COMPARISON:  Images of previous study done on 04/26/2001 are not available for review. FINDINGS: No recent displaced fracture or dislocation is seen. There are small smooth marginated calcifications adjacent to the tips of medial and lateral malleoli, more so adjacent to the lateral malleolus. In the oblique view, there is faint linear lucency in the articular surface of tibia close to the medial malleolus. This finding could not be seen in the rest of the images. There is a surgical screw in the medial malleolus residual from previous internal fixation. There is a metallic sideplate and multiple surgical screws in the distal shaft of left tibia. A surgical screw is seen in the fifth metatarsal in the lateral view. There is soft tissue swelling over the lateral malleolus. IMPRESSION: No recent displaced fracture or  dislocation is seen. There is faint linear lucency in the the articular surface of distal tibia close to the medial malleolus seen in the single view. This may be an artifact due to confluence of normal trabeculae or undisplaced fracture. 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 Electronically Signed   By: Ernie Avena M.D.   On: 12/23/2020 10:59    Procedures Procedures (including critical care time)  Medications Ordered in UC Medications - No data to display  Initial Impression / Assessment and Plan / UC Course  I have reviewed the triage vital signs and the nursing notes.  Pertinent labs & imaging results that were available during my care of the patient were reviewed by me and considered in my medical decision making (see chart for details).  X-ray with questionable nondisplaced fracture, recommended given symptoms that patient continue to wear cam boot and follow-up with Ortho.  Patient agrees with same.  Encouraged ibuprofen if needed for pain in the meantime.  Final Clinical Impressions(s) / UC Diagnoses   Final diagnoses:  Injury of left ankle, initial encounter     Discharge Instructions      Please make follow up appointment with ortho. Recommend ibuprofen if needed for pain in the meantime.      ED Prescriptions   None    PDMP not reviewed this encounter.   Tomi Bamberger, PA-C 12/23/20 1122

## 2020-12-23 NOTE — Discharge Instructions (Signed)
Please make follow up appointment with ortho. Recommend ibuprofen if needed for pain in the meantime.

## 2020-12-23 NOTE — ED Triage Notes (Signed)
Pt states he rolled his lt ankle on 11/11. C/o still having swelling and pain. Hx of surgery to that ankle. Pt is wearing a cam boot with relief.

## 2023-02-24 IMAGING — DX DG ANKLE COMPLETE 3+V*L*
3 series · 3 of 3 positions shown · non-contrast
Comparison: Images of previous study done on 04/26/2001 are not
available for review.

CLINICAL DATA: Trauma, pain

EXAM:
LEFT ANKLE COMPLETE - 3+ VIEW

[ankle ap]
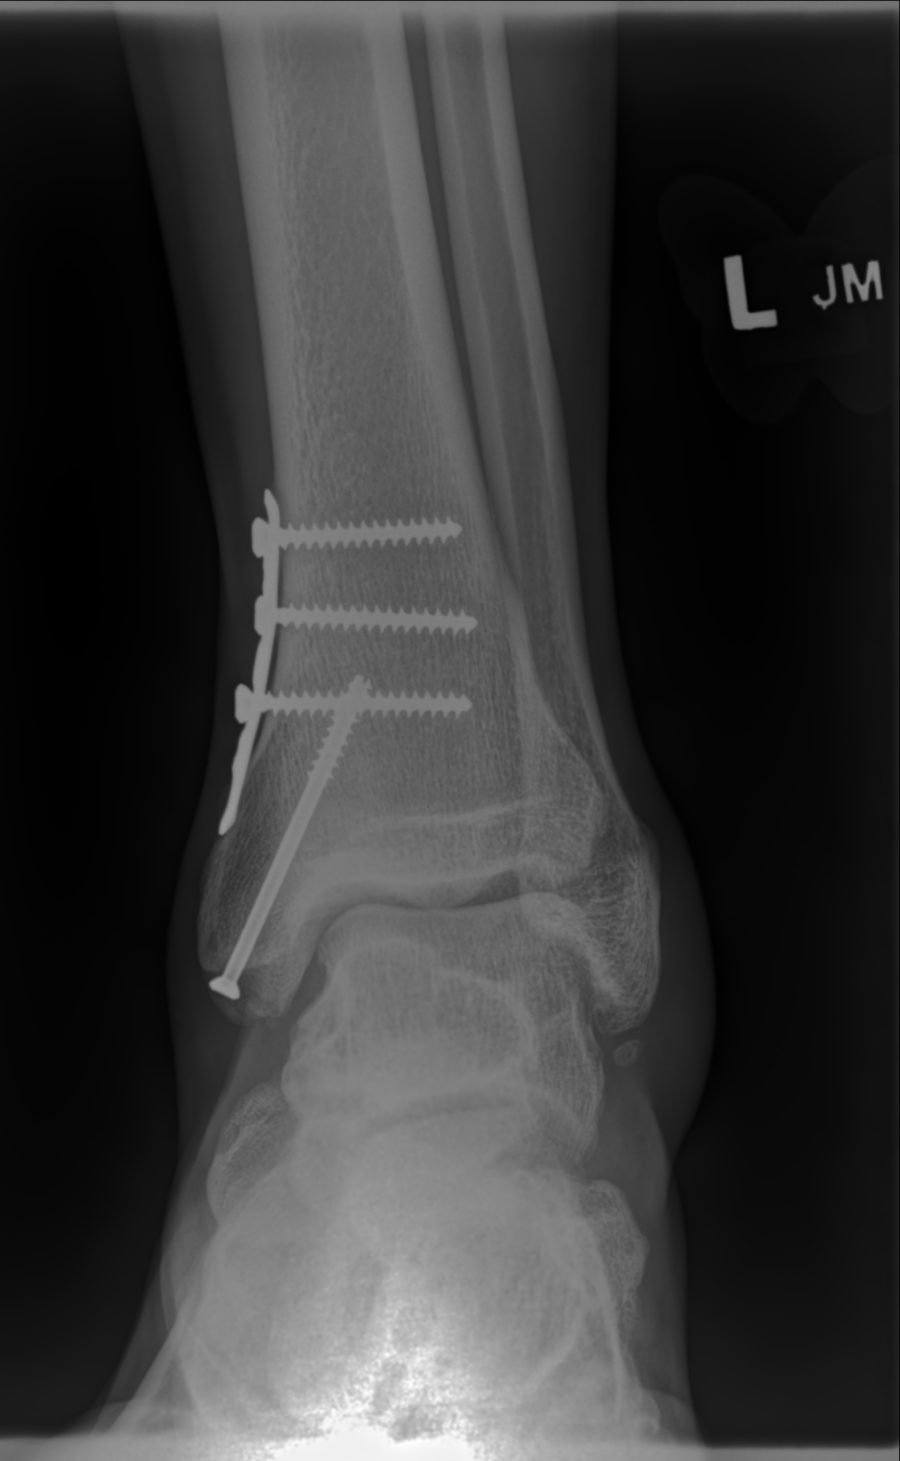

[ankle medial oblique]
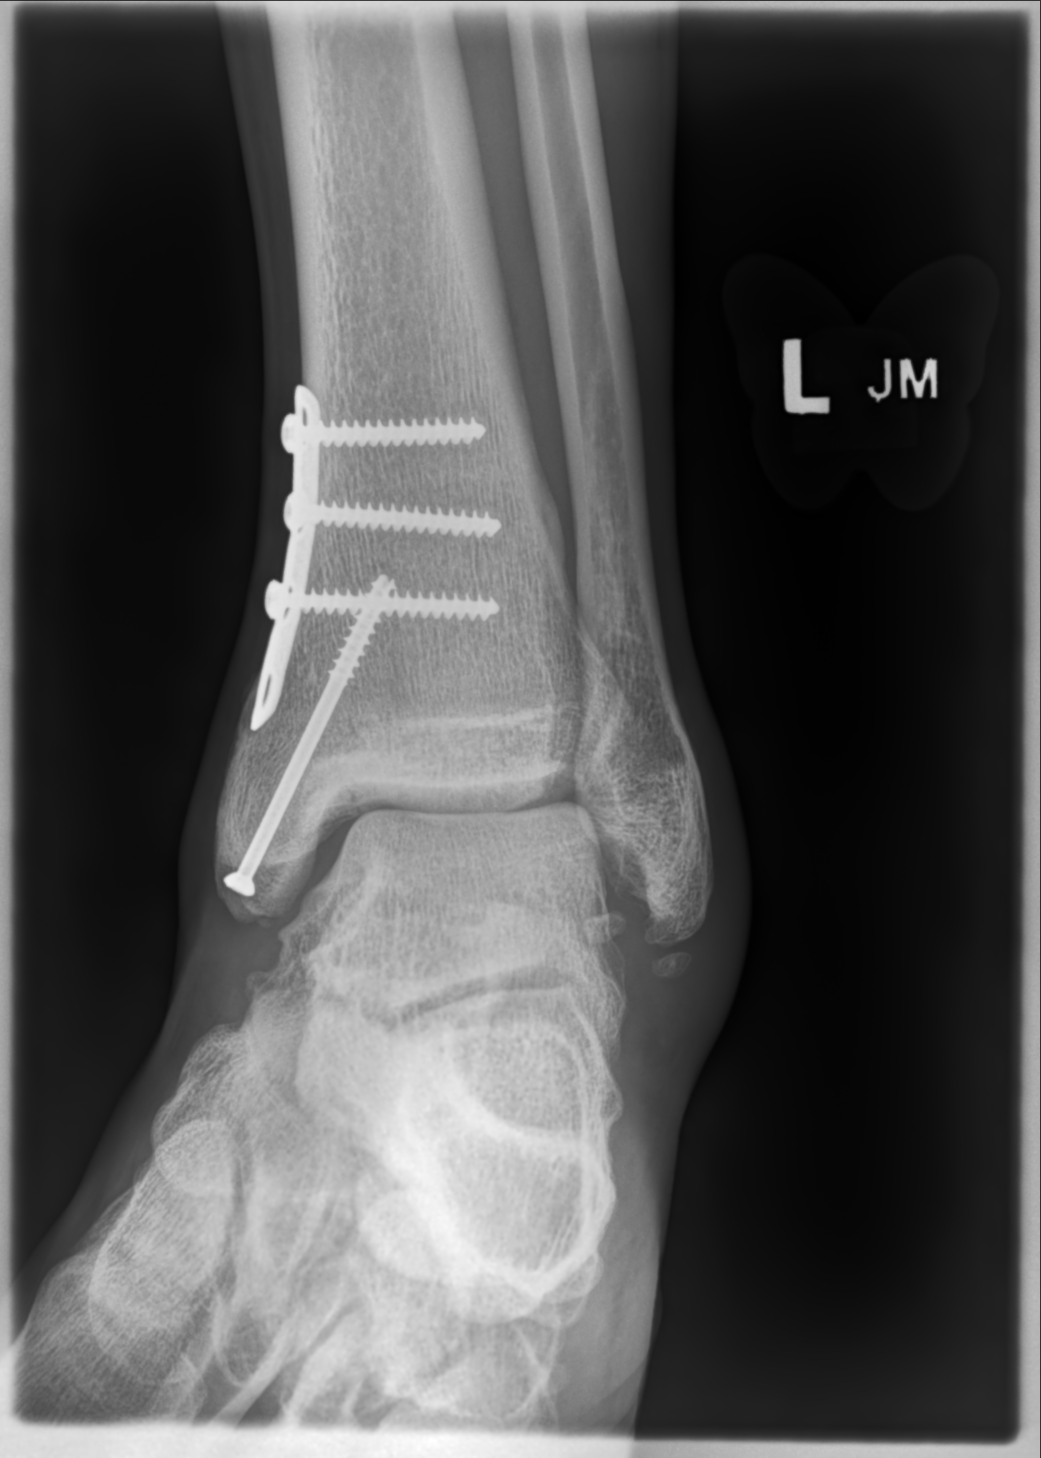

[ankle lat]
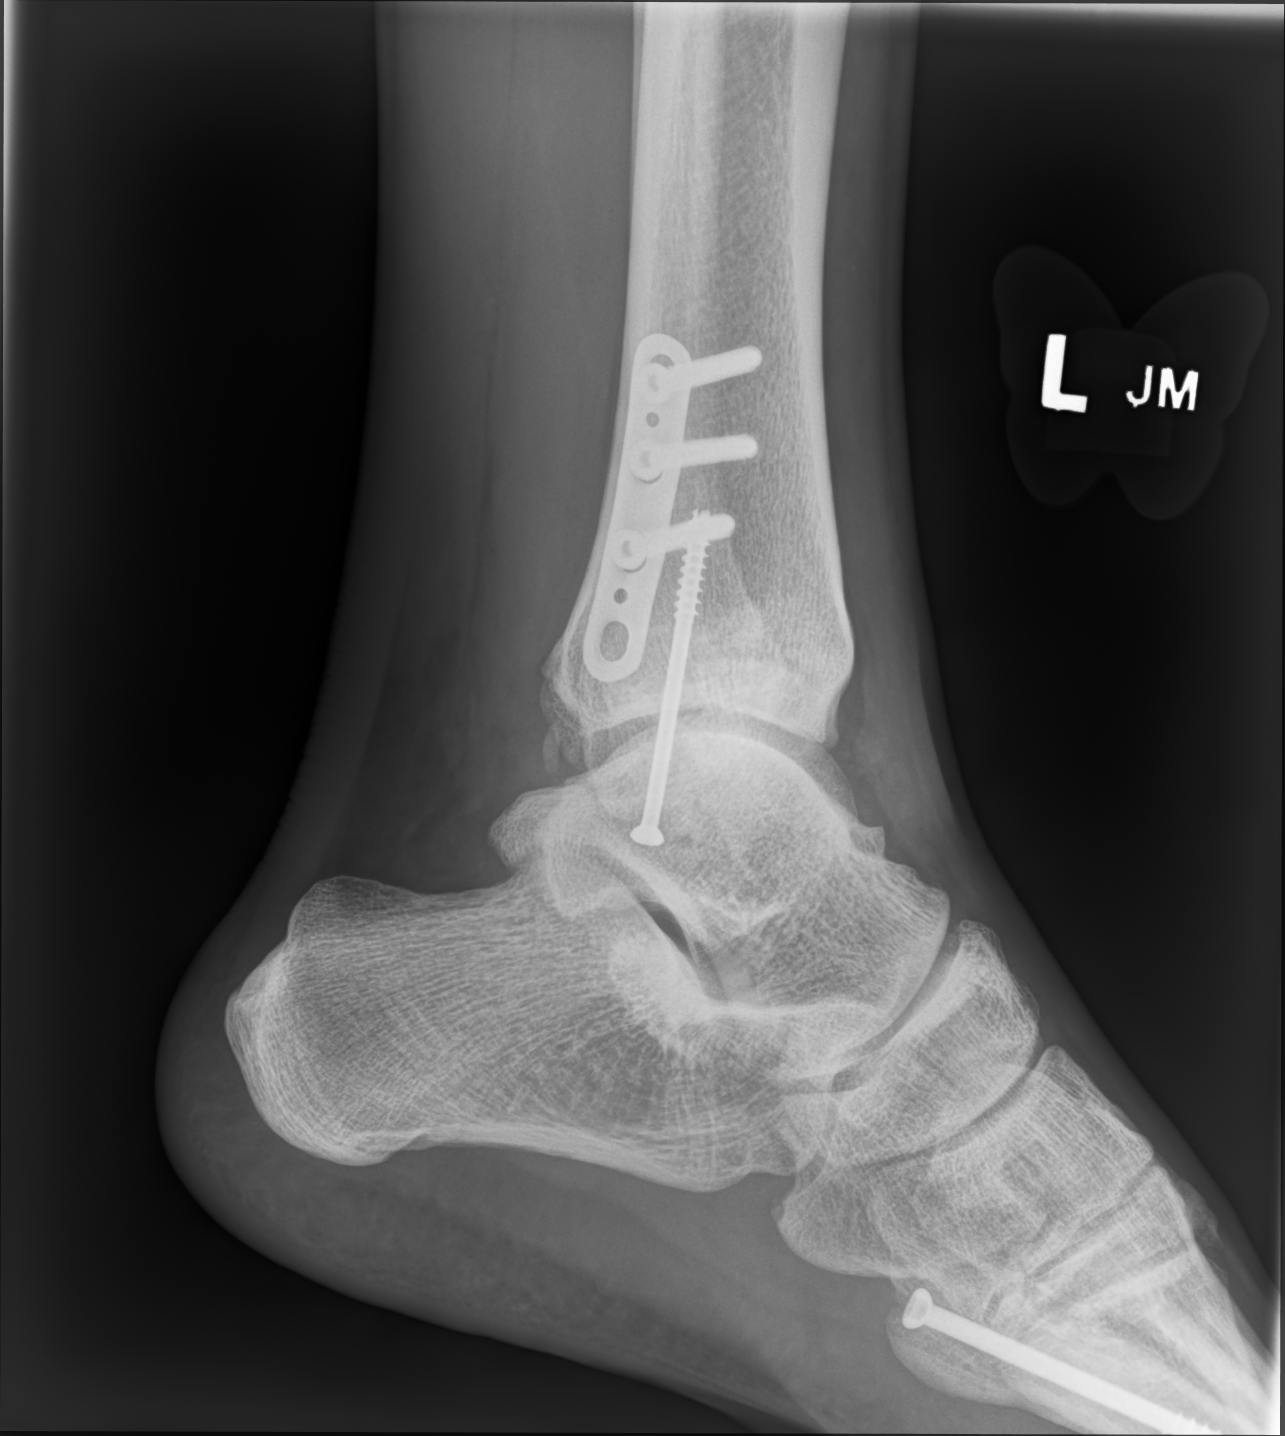

[3 of 3 positions shown; findings below may reference images not displayed]

FINDINGS: No recent displaced fracture or dislocation is seen. There are small
smooth marginated calcifications adjacent to the tips of medial and
lateral malleoli, more so adjacent to the lateral malleolus. In the
oblique view, there is faint linear lucency in the articular surface
of tibia close to the medial malleolus. This finding could not be
seen in the rest of the images. There is a surgical screw in the
medial malleolus residual from previous internal fixation. There is
a metallic sideplate and multiple surgical screws in the distal
shaft of left tibia. A surgical screw is seen in the fifth
metatarsal in the lateral view. There is soft tissue swelling over
the lateral malleolus.
IMPRESSION: No recent displaced fracture or dislocation is seen. There is faint
linear lucency in the the articular surface of distal tibia close to
the medial malleolus seen in the single view. This may be an
artifact due to confluence of normal trabeculae or undisplaced
fracture. There are multiple smooth marginated calcifications
adjacent to the distal tibia and fibula suggesting old ununited
avulsions.
